# Patient Record
Sex: Male | Born: 1935 | Race: White | Hispanic: No | Marital: Married | Smoking: Former smoker
Health system: Southern US, Community
[De-identification: ages and names within clinical notes are randomized; demographics above are authoritative.]

## PROBLEM LIST (undated history)

## (undated) DIAGNOSIS — Z8719 Personal history of other diseases of the digestive system: Secondary | ICD-10-CM

## (undated) DIAGNOSIS — C801 Malignant (primary) neoplasm, unspecified: Secondary | ICD-10-CM

## (undated) DIAGNOSIS — M1990 Unspecified osteoarthritis, unspecified site: Secondary | ICD-10-CM

## (undated) DIAGNOSIS — I10 Essential (primary) hypertension: Secondary | ICD-10-CM

## (undated) DIAGNOSIS — C4490 Unspecified malignant neoplasm of skin, unspecified: Secondary | ICD-10-CM

## (undated) DIAGNOSIS — E119 Type 2 diabetes mellitus without complications: Secondary | ICD-10-CM

## (undated) DIAGNOSIS — E7800 Pure hypercholesterolemia, unspecified: Secondary | ICD-10-CM

## (undated) DIAGNOSIS — K219 Gastro-esophageal reflux disease without esophagitis: Secondary | ICD-10-CM

## (undated) HISTORY — PX: CHOLECYSTECTOMY: SHX55

## (undated) HISTORY — PX: CATARACT EXTRACTION W/ INTRAOCULAR LENS  IMPLANT, BILATERAL: SHX1307

## (undated) HISTORY — PX: JOINT REPLACEMENT: SHX530

## (undated) HISTORY — PX: HERNIA REPAIR: SHX51

---

## 2003-08-22 ENCOUNTER — Other Ambulatory Visit: Payer: Self-pay

## 2007-10-03 ENCOUNTER — Ambulatory Visit: Payer: Self-pay | Admitting: Internal Medicine

## 2007-10-27 ENCOUNTER — Ambulatory Visit: Payer: Self-pay | Admitting: Family Medicine

## 2007-10-28 ENCOUNTER — Ambulatory Visit: Payer: Self-pay | Admitting: Family Medicine

## 2008-06-06 ENCOUNTER — Ambulatory Visit: Payer: Self-pay | Admitting: Orthopedic Surgery

## 2008-06-13 ENCOUNTER — Inpatient Hospital Stay: Payer: Self-pay | Admitting: Orthopedic Surgery

## 2008-06-21 ENCOUNTER — Encounter: Payer: Self-pay | Admitting: Internal Medicine

## 2008-06-28 ENCOUNTER — Ambulatory Visit: Payer: Self-pay | Admitting: Orthopedic Surgery

## 2008-06-28 ENCOUNTER — Encounter: Payer: Self-pay | Admitting: Internal Medicine

## 2008-07-02 ENCOUNTER — Encounter: Payer: Self-pay | Admitting: Orthopedic Surgery

## 2008-07-09 ENCOUNTER — Ambulatory Visit: Payer: Self-pay | Admitting: Family Medicine

## 2008-07-28 ENCOUNTER — Encounter: Payer: Self-pay | Admitting: Orthopedic Surgery

## 2009-05-11 IMAGING — CR DG CHEST 2V
1 series · 3 of 3 positions shown · non-contrast
Comparison: none

REASON FOR EXAM: f/u pneumonia
COMMENTS:

PROCEDURE:     DXR - DXR CHEST PA (OR AP) AND LATERAL  - June 20, 2008  [DATE]
RESULT:     Comparison: 06/15/2008

[Series 1: view not recorded · 0.17mm/px · 3 of 3 slices shown]
[im 1/3]
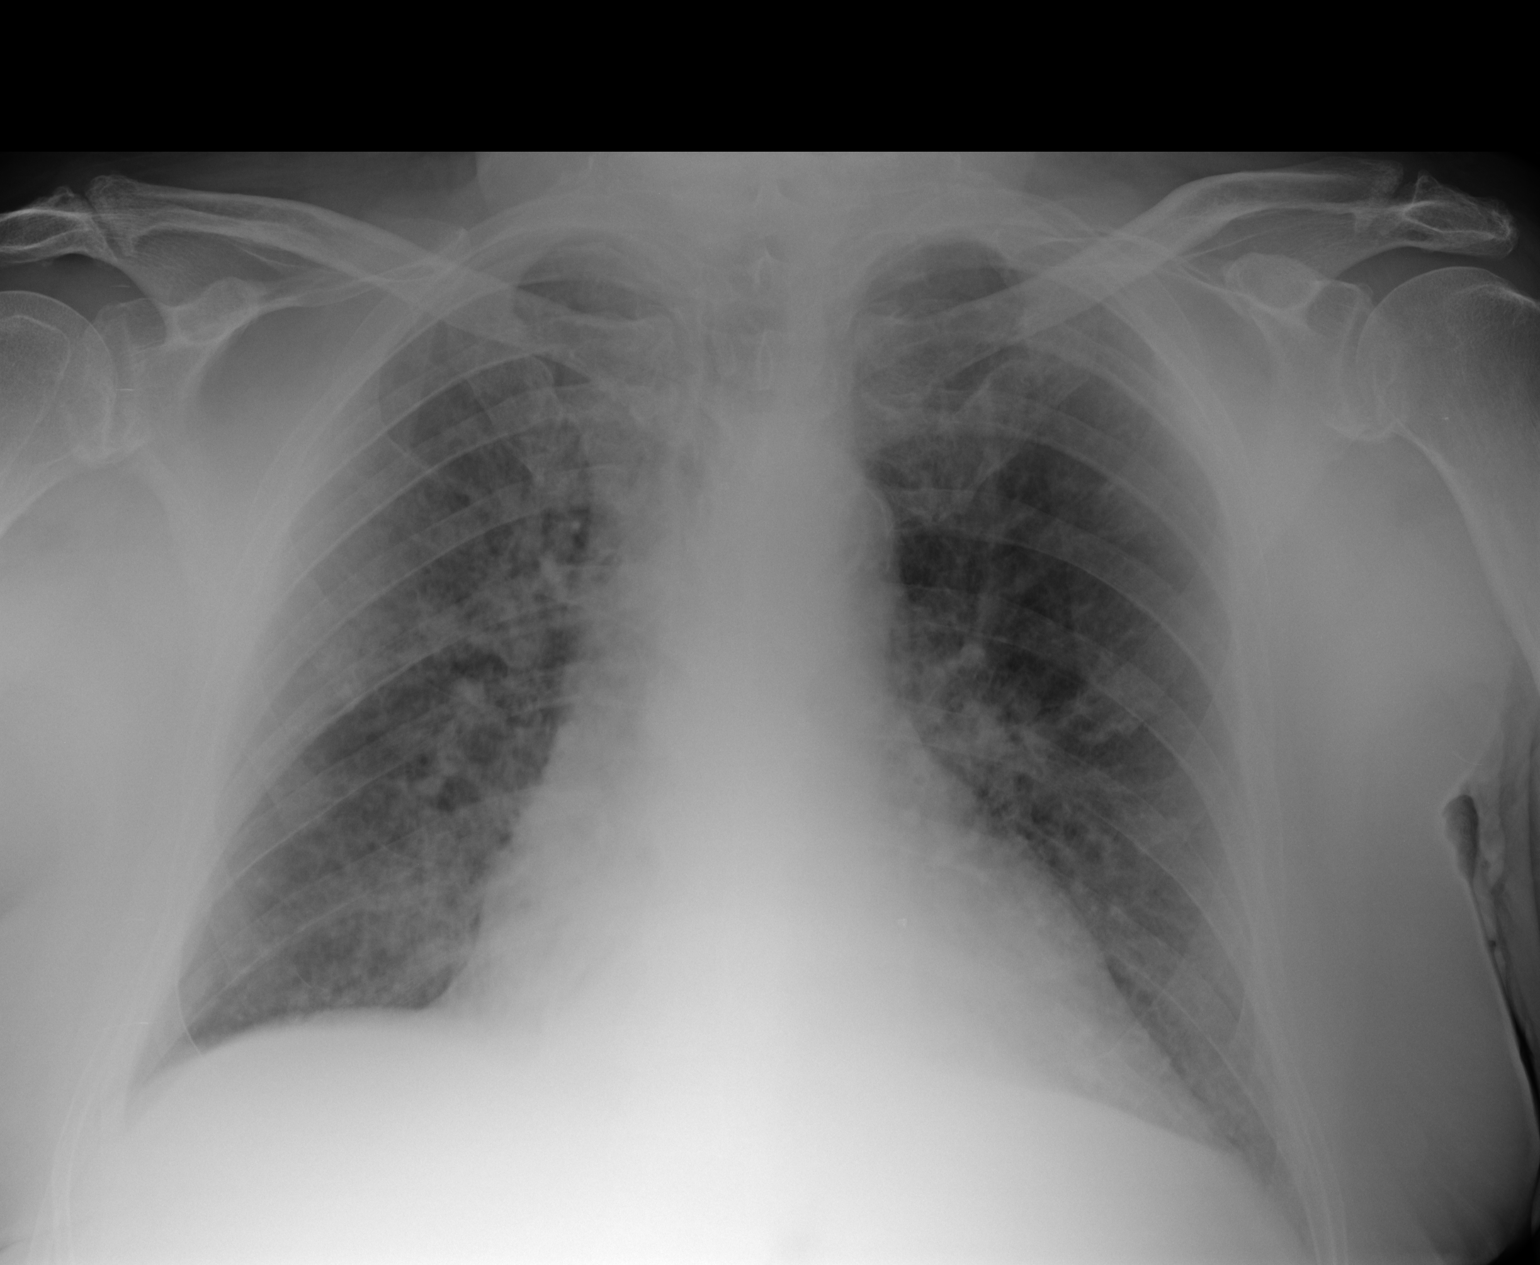
[im 2/3]
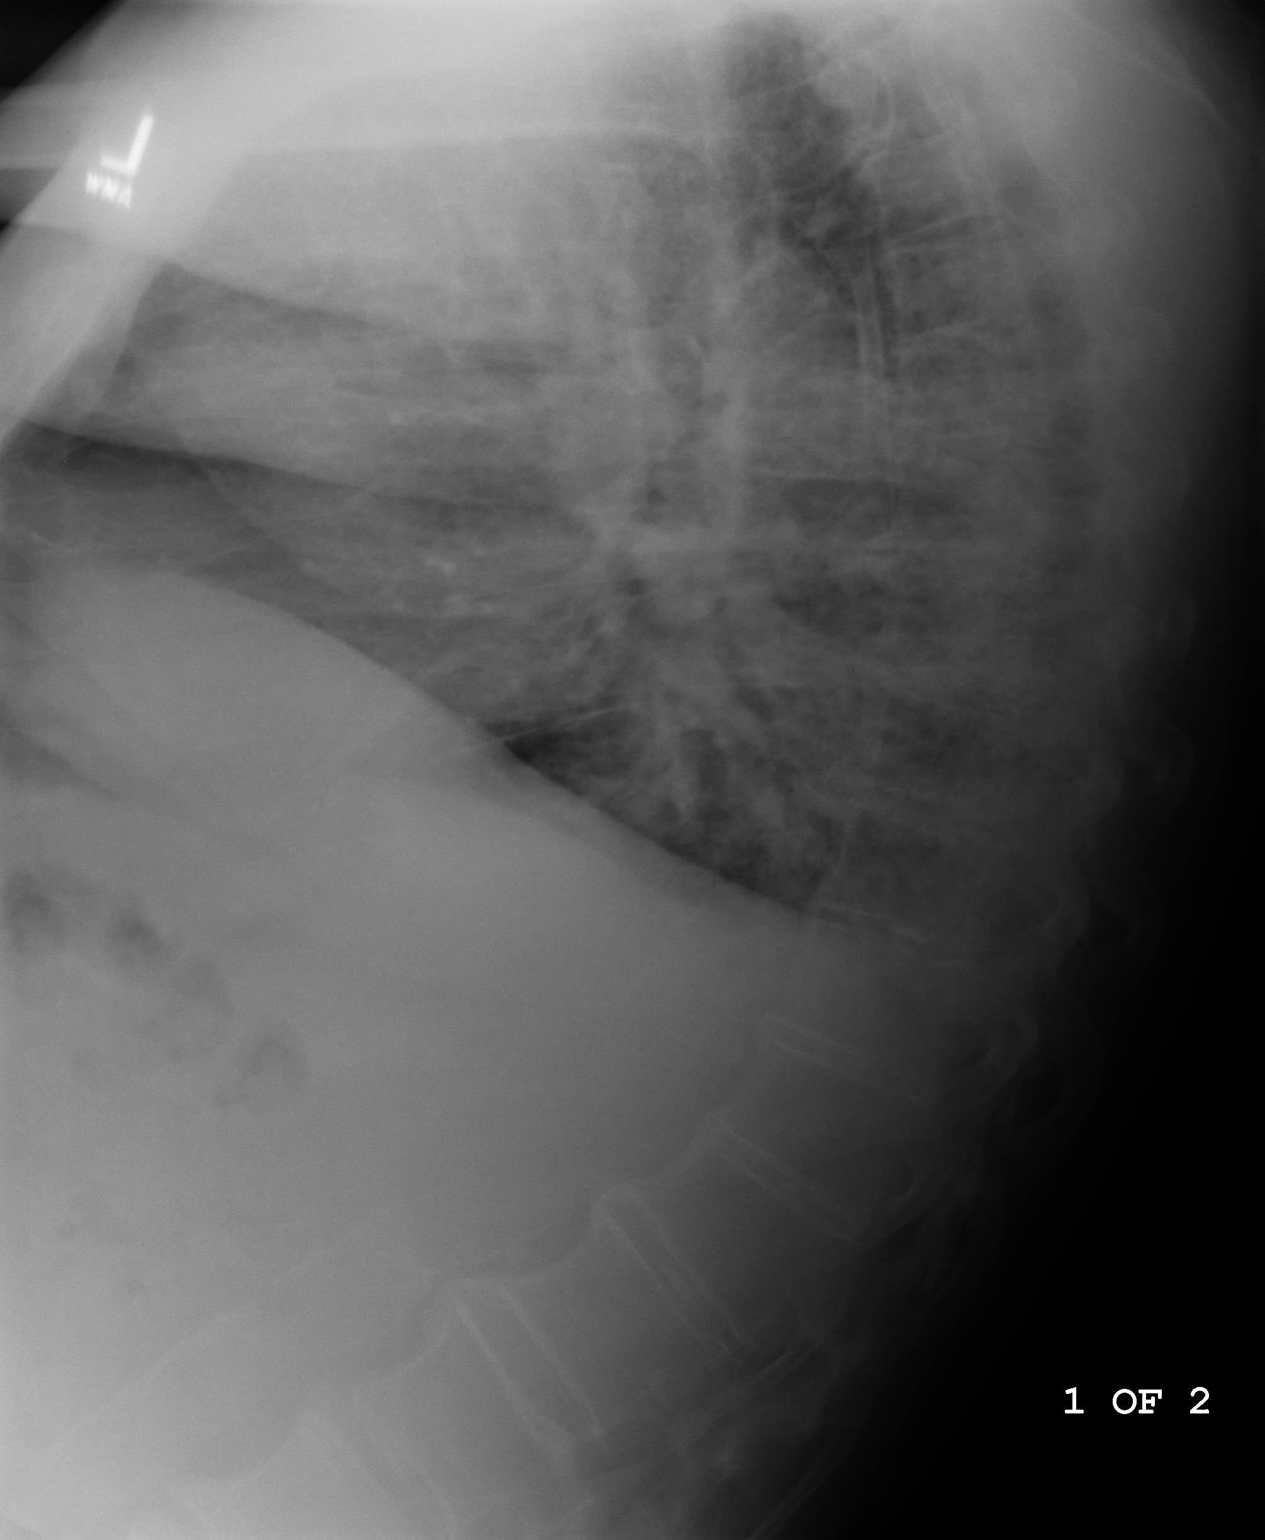
[im 3/3]
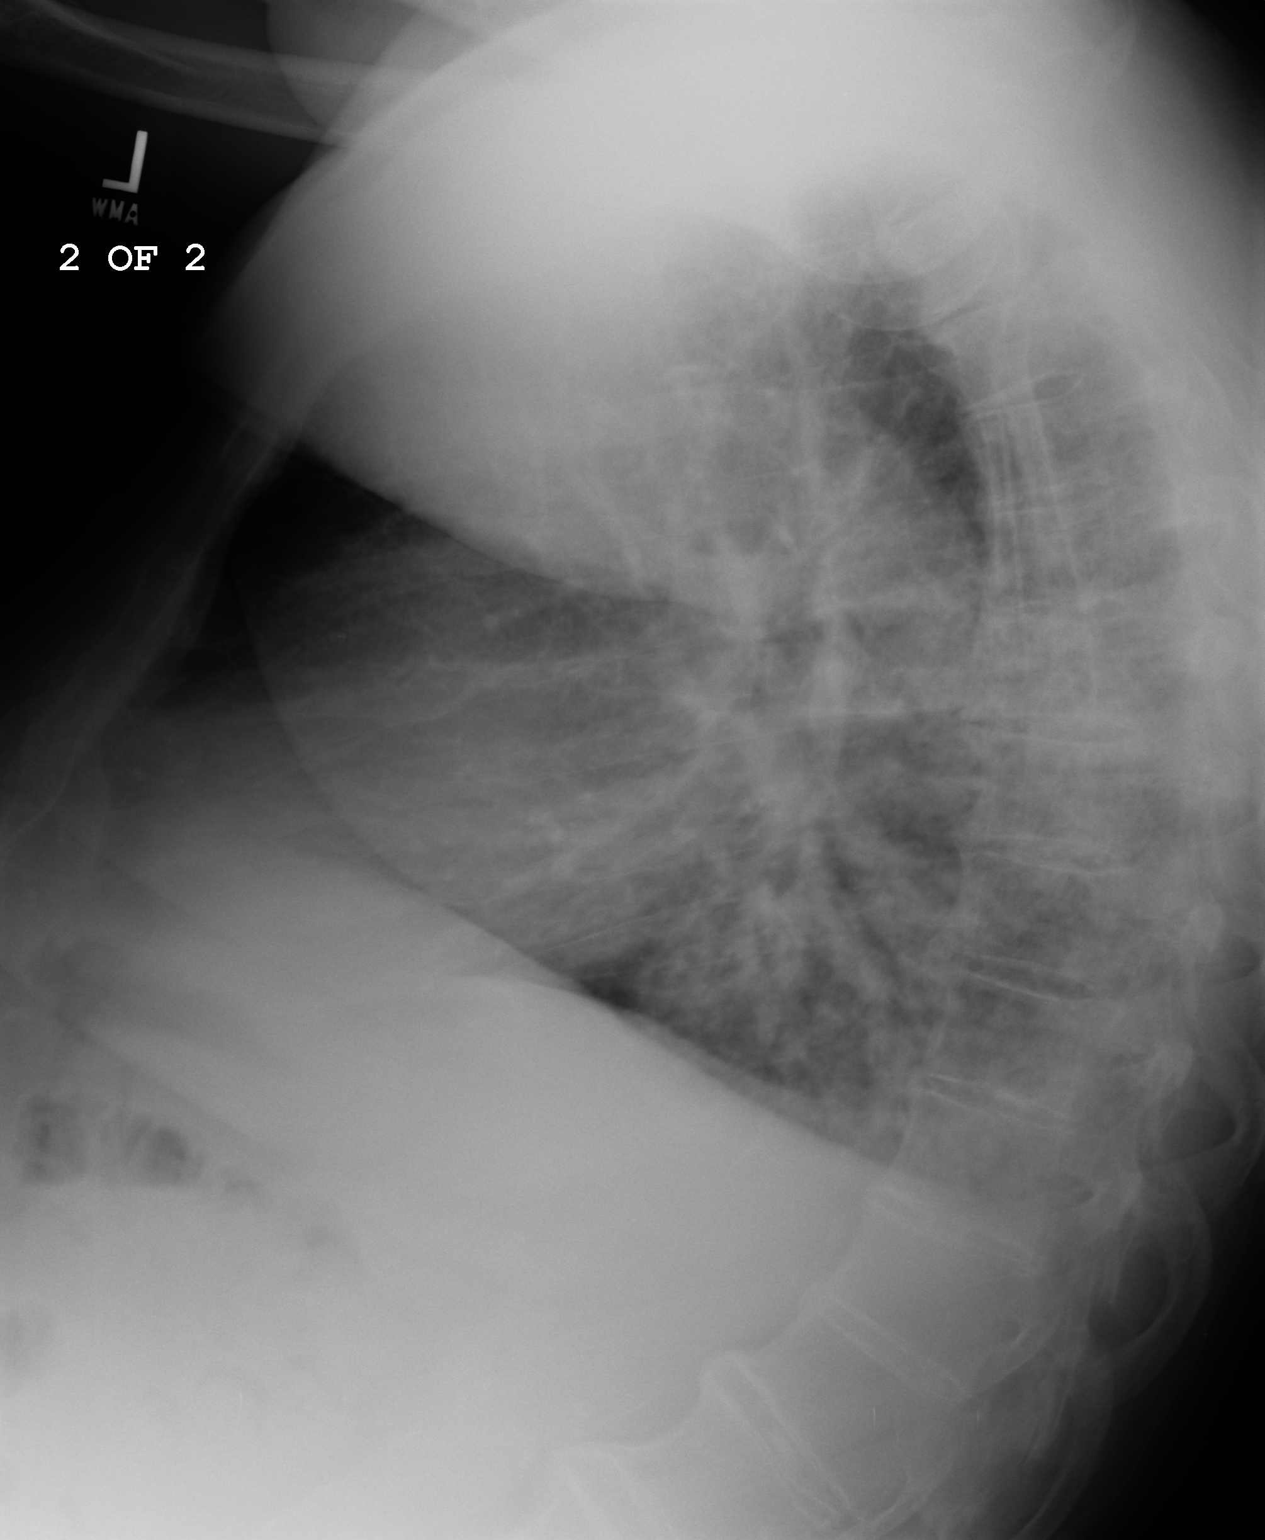

[3 of 3 positions shown; findings below may reference images not displayed]

FINDINGS: PA and lateral chest radiographs are provided. There are is bilateral patchy
interstitial and alveolar airspace disease, left greater than right. There
is no significant pleural effusion. The heart size is mildly enlarged. There
is thoracic spine spondylosis.
IMPRESSION: Bilateral patchy interstitial and alveolar airspace opacities with
cardiomegaly. This may reflect mild pulmonary edema, although an
interstitial pneumonitis can have a similar appearance. Correlate with
clinical findings.

## 2009-05-30 IMAGING — CR DG CHEST 2V
1 series · 2 of 2 positions shown · non-contrast
Comparison: none

REASON FOR EXAM: f/u pneumonia  density right upper lobe
COMMENTS:

[Series 1: view not recorded · 0.17mm/px · 2 of 2 slices shown]
[im 1/2]
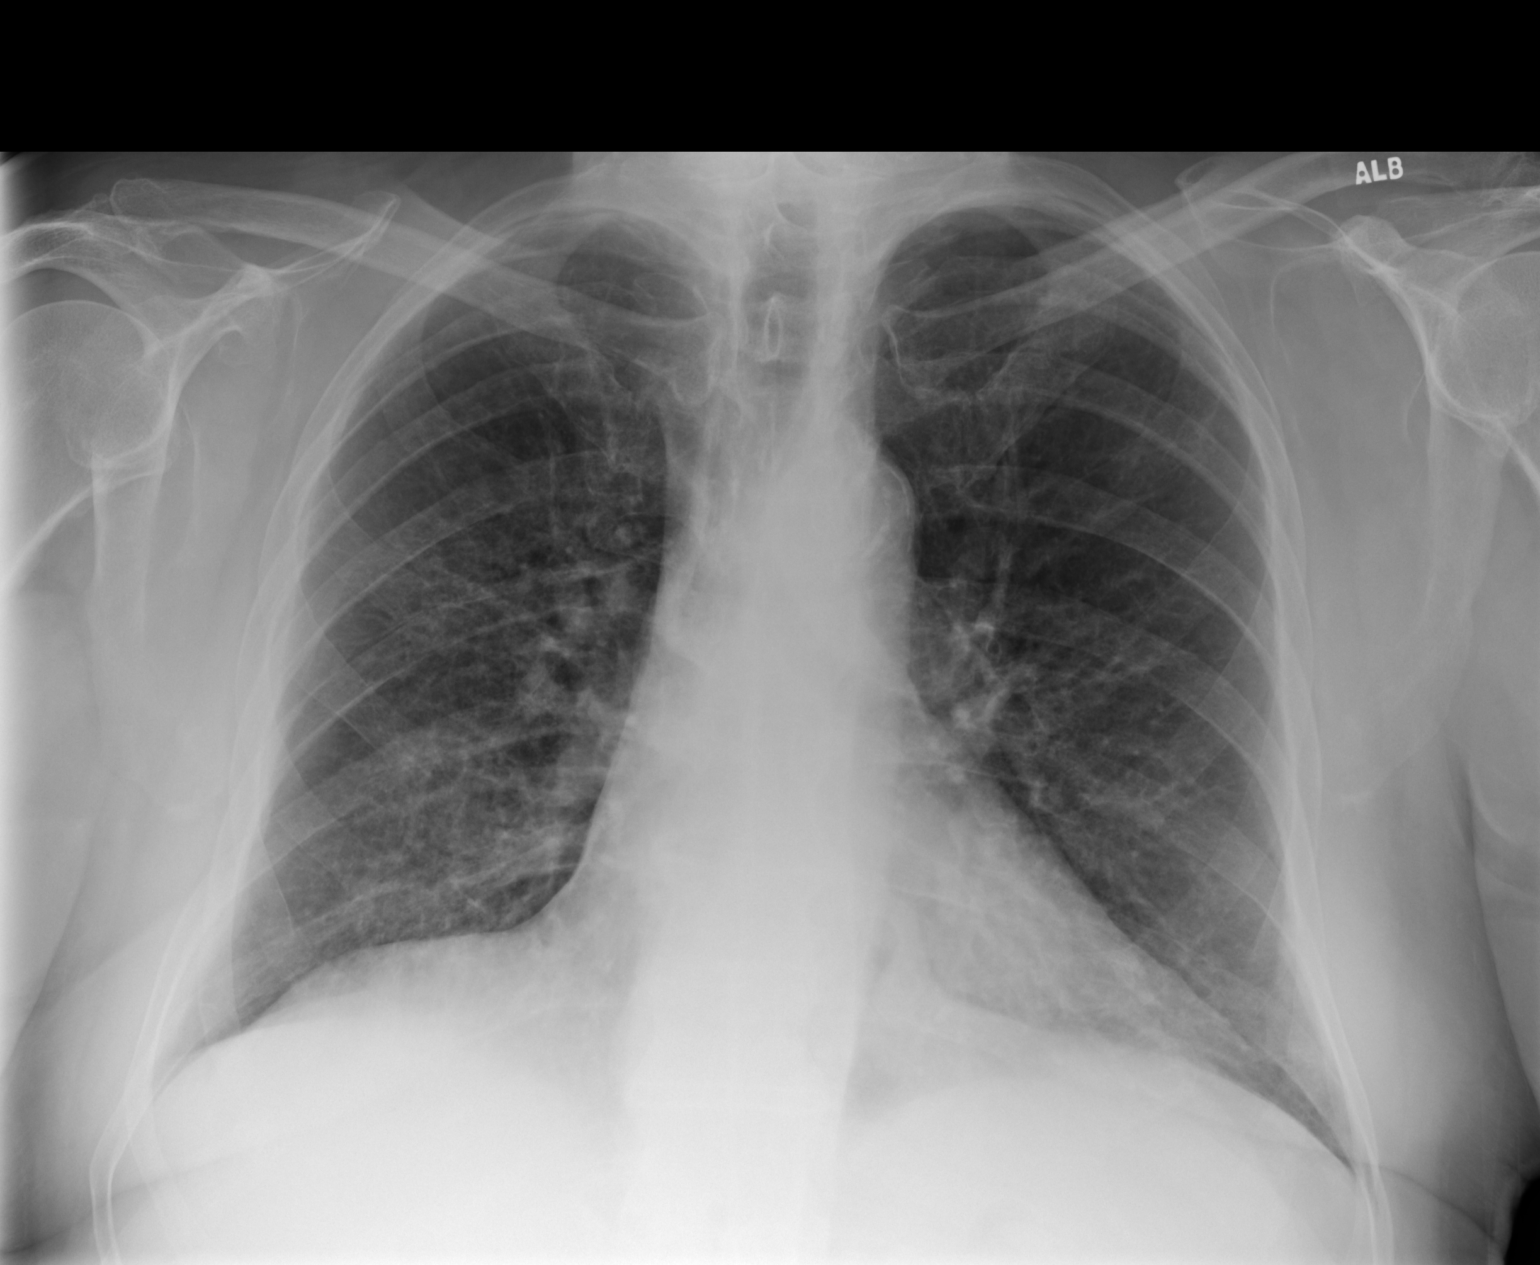
[im 2/2]
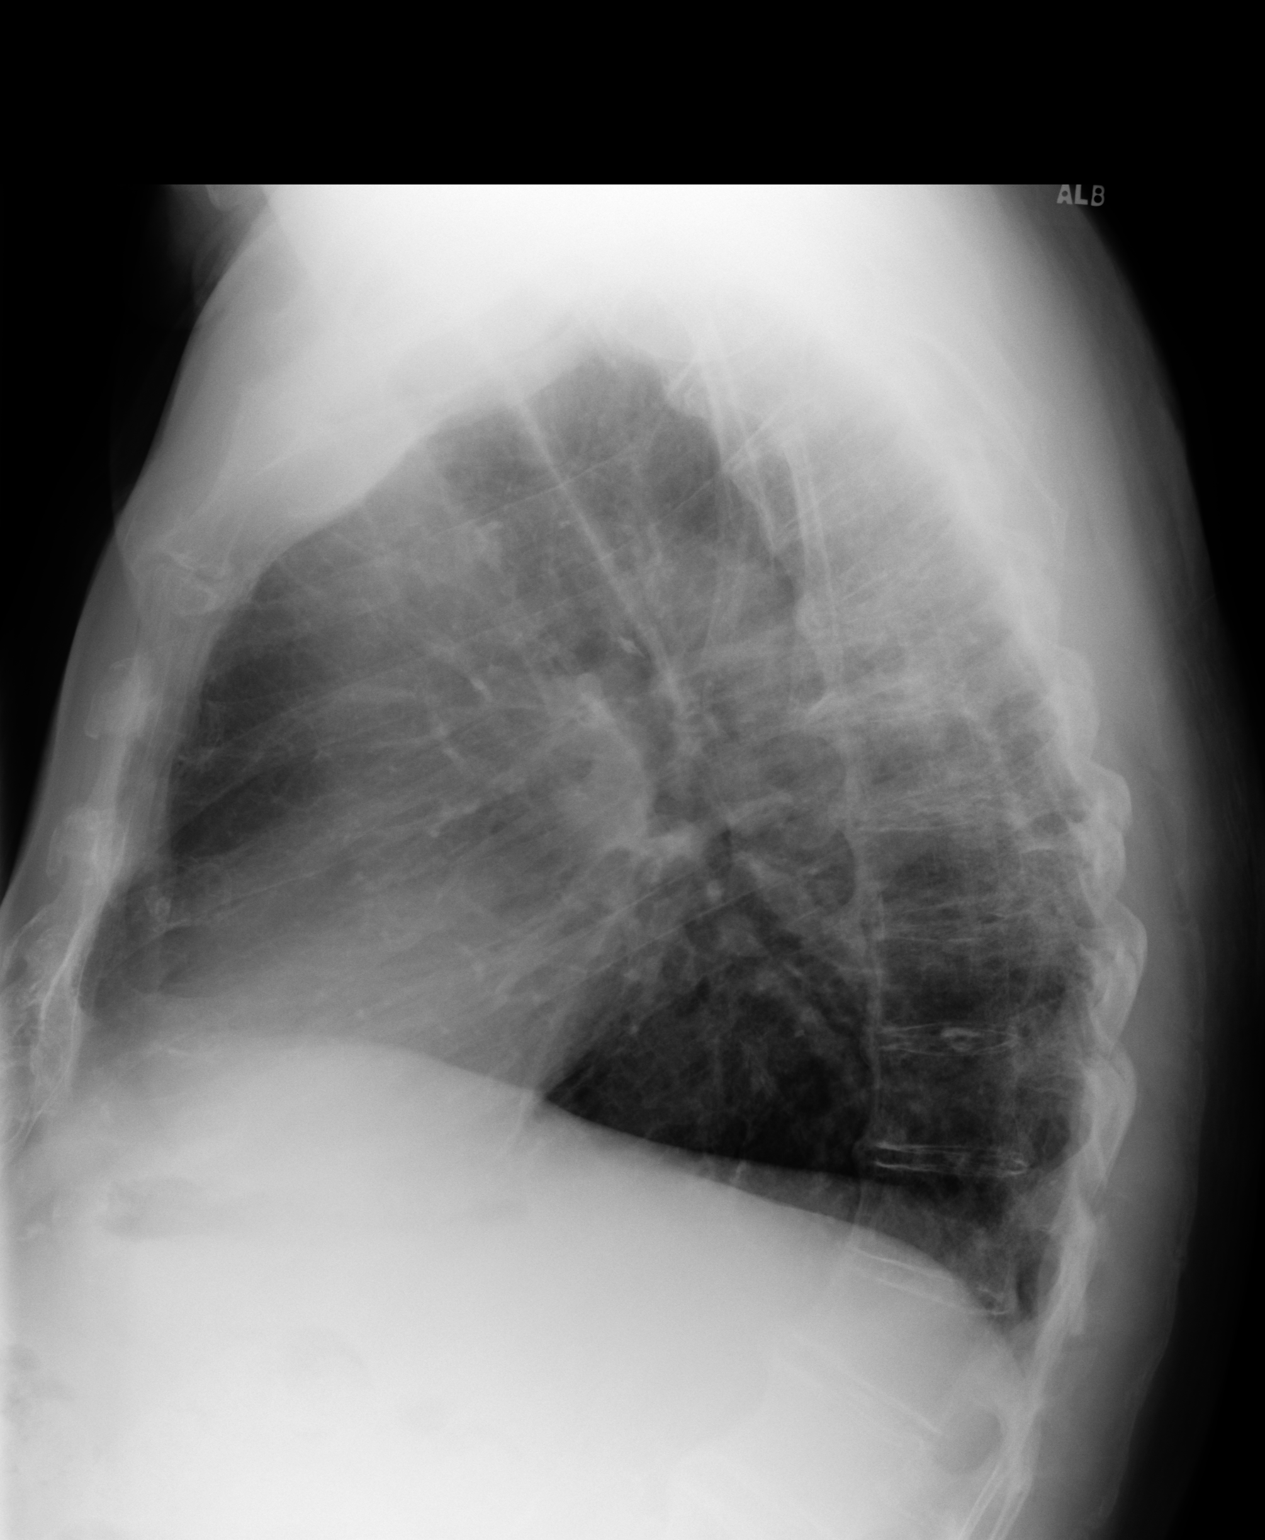

[2 of 2 positions shown; findings below may reference images not displayed]

PROCEDURE:     MDR - MDR CHEST PA(OR AP) AND LATERAL  - July 09, 2008  [DATE]

RESULT:     The previously noted patchy density in the right upper lobe
shows complete or near complete clearing. No new focal infiltrates are seen.
There is again noted thickening of the lung markings bilaterally consistent
with chronic inflammatory change. The chest is hyperexpanded compatible with
COPD. Heart size is normal.
IMPRESSION: 1. There has been complete or near complete clearing of the previously noted
right upper lobe infiltrate.
2. COPD.

## 2011-05-20 ENCOUNTER — Ambulatory Visit: Payer: Self-pay | Admitting: Ophthalmology

## 2011-07-22 ENCOUNTER — Ambulatory Visit: Payer: Self-pay | Admitting: Ophthalmology

## 2015-02-18 ENCOUNTER — Ambulatory Visit
Admission: EM | Admit: 2015-02-18 | Discharge: 2015-02-18 | Disposition: A | Discharge status: Home / Self Care | Payer: PPO | Attending: Family Medicine | Admitting: Family Medicine

## 2015-02-18 ENCOUNTER — Encounter: Payer: Self-pay | Admitting: *Deleted

## 2015-02-18 DIAGNOSIS — M542 Cervicalgia: Secondary | ICD-10-CM

## 2015-02-18 DIAGNOSIS — S161XXA Strain of muscle, fascia and tendon at neck level, initial encounter: Secondary | ICD-10-CM

## 2015-02-18 DIAGNOSIS — R079 Chest pain, unspecified: Secondary | ICD-10-CM | POA: Diagnosis present

## 2015-02-18 HISTORY — DX: Malignant (primary) neoplasm, unspecified: C80.1

## 2015-02-18 HISTORY — DX: Type 2 diabetes mellitus without complications: E11.9

## 2015-02-18 HISTORY — DX: Essential (primary) hypertension: I10

## 2015-02-18 MED ORDER — HYDROCODONE-ACETAMINOPHEN 5-325 MG PO TABS: ORAL_TABLET | ORAL | Status: DC

## 2015-02-18 MED ORDER — IBUPROFEN 600 MG PO TABS
600.0000 mg | ORAL_TABLET | Freq: Once | ORAL | Status: AC
  Administered 2015-02-18: 600 mg via ORAL

## 2015-02-18 NOTE — ED Notes (Signed)
Starting this AM, both sides of neck began hurting, now radiating to left elbow and across chest.  O2 at 2/liters applied, ekg obtained.

## 2015-03-21 NOTE — ED Provider Notes (Signed)
CSN: [id number]     Arrival date & time 02/18/15  1007 History   First MD Initiated Contact with Patient 02/18/15 1233     Chief Complaint  Patient presents with  . Chest Pain   (Consider location/radiation/quality/duration/timing/severity/associated sxs/prior Treatment) HPI Comments: 81 yo male with a c/o neck pain since this morning. States woke up with the pain, which intermittently radiates to left elbow. Denies any injuries, trauma, falls, numbness/tingling, weakness, shortness of breath, or chest pressure.   The history is provided by the patient.    Past Medical History  Diagnosis Date  . Diabetes mellitus without complication ([place])   . Hypertension   . Cancer [facility])    Past Surgical History  Procedure Laterality Date  . Cholecystectomy    . Joint replacement     History reviewed. No pertinent family history. Social History  Substance Use Topics  . Smoking status: Former Research scientist (life sciences)  . Smokeless tobacco: None  . Alcohol Use: No    Review of Systems  Allergies  Review of patient's allergies indicates not on file.  Home Medications   Prior to Admission medications   Medication Sig Start Date End Date Taking? Authorizing Provider  insulin glargine (LANTUS) 100 UNIT/ML injection Inject into the skin at bedtime.   Yes Historical Provider, MD  HYDROcodone-acetaminophen (NORCO/VICODIN) 5-325 MG tablet 1-2 tabs po qd 8 hours prn 02/18/15   Norval Gable, MD   Meds Ordered and Administered this Visit   Medications  ibuprofen (ADVIL,MOTRIN) tablet 600 mg (600 mg Oral Given 02/18/15 1240)    BP 154/56 mmHg  Pulse 72  Temp(Src) 97.2 F (36.2 C) (Oral)  Resp 20  Ht 5\' 10"  (1.778 m)  Wt 225 lb (102.059 kg)  BMI 32.28 kg/m2  SpO2 100% No data found.   Physical Exam  Constitutional: He appears well-developed and well-nourished. No distress.  HENT:  Head: Normocephalic and atraumatic.  Right Ear: Tympanic membrane, external ear and ear canal normal.  Left Ear:  Tympanic membrane, external ear and ear canal normal.  Nose: Nose normal.  Mouth/Throat: Uvula is midline, oropharynx is clear and moist and mucous membranes are normal. No oropharyngeal exudate or tonsillar abscesses.  Eyes: Conjunctivae and EOM are normal. Pupils are equal, round, and reactive to light. Right eye exhibits no discharge. Left eye exhibits no discharge. No scleral icterus.  Neck: Normal range of motion. Neck supple. No tracheal deviation present. No thyromegaly present.  Cardiovascular: Normal rate, regular rhythm and normal heart sounds.   Pulmonary/Chest: Effort normal and breath sounds normal. No stridor. No respiratory distress. He has no wheezes. He has no rales. He exhibits no tenderness.  Musculoskeletal:       Cervical back: He exhibits tenderness (over the cervical paraspinous muscles and trapezius muscles).  Lymphadenopathy:    He has no cervical adenopathy.  Neurological: He is alert.  Skin: Skin is warm and dry. No rash noted. He is not diaphoretic.  Nursing note and vitals reviewed.   ED Course  Procedures (including critical care time)  Labs Review Labs Reviewed - No data to display  Imaging Review No results found.   Visual Acuity Review  Right Eye Distance:   Left Eye Distance:   Bilateral Distance:    Right Eye Near:   Left Eye Near:    Bilateral Near:       EKG: normal EKG, normal sinus rhythm, there are no previous tracings available for comparison; reviewed by me and agree.  MDM   1. Neck  pain   2. Neck strain, initial encounter    Discharge Medication List as of 02/18/2015  1:42 PM    START taking these medications   Details  HYDROcodone-acetaminophen (NORCO/VICODIN) 5-325 MG tablet 1-2 tabs po qd 8 hours prn, Print       1. EKG result diagnosis reviewed with patient  2. rx as per orders above; reviewed possible side effects, interactions, risks and benefits  3. Recommend supportive treatment with rest, ice/heat, gentle range  of motion exercises 4. Follow-up prn if symptoms worsen or don't improve    Norval Gable, MD 03/21/15 1843

## 2015-05-16 DIAGNOSIS — D485 Neoplasm of uncertain behavior of skin: Secondary | ICD-10-CM | POA: Diagnosis not present

## 2015-05-16 DIAGNOSIS — C44712 Basal cell carcinoma of skin of right lower limb, including hip: Secondary | ICD-10-CM | POA: Diagnosis not present

## 2015-05-16 DIAGNOSIS — L820 Inflamed seborrheic keratosis: Secondary | ICD-10-CM | POA: Diagnosis not present

## 2015-05-24 DIAGNOSIS — C44712 Basal cell carcinoma of skin of right lower limb, including hip: Secondary | ICD-10-CM | POA: Diagnosis not present

## 2015-09-04 DIAGNOSIS — H02009 Unspecified entropion of unspecified eye, unspecified eyelid: Secondary | ICD-10-CM | POA: Diagnosis not present

## 2015-09-05 DIAGNOSIS — H02002 Unspecified entropion of right lower eyelid: Secondary | ICD-10-CM | POA: Diagnosis not present

## 2015-09-10 DIAGNOSIS — H5712 Ocular pain, left eye: Secondary | ICD-10-CM | POA: Diagnosis not present

## 2015-10-03 DIAGNOSIS — H02045 Spastic entropion of left lower eyelid: Secondary | ICD-10-CM | POA: Diagnosis not present

## 2015-10-04 DIAGNOSIS — H169 Unspecified keratitis: Secondary | ICD-10-CM | POA: Diagnosis not present

## 2015-11-17 ENCOUNTER — Ambulatory Visit
Admission: EM | Admit: 2015-11-17 | Discharge: 2015-11-17 | Disposition: A | Discharge status: Home / Self Care | Payer: PPO | Attending: Family Medicine | Admitting: Family Medicine

## 2015-11-17 DIAGNOSIS — H109 Unspecified conjunctivitis: Secondary | ICD-10-CM | POA: Diagnosis not present

## 2015-11-17 DIAGNOSIS — S0501XA Injury of conjunctiva and corneal abrasion without foreign body, right eye, initial encounter: Secondary | ICD-10-CM

## 2015-11-17 MED ORDER — CARBOXYMETHYLCELLULOSE SODIUM 1 % OP SOLN: 1.0000 [drp] | Freq: Three times a day (TID) | OPHTHALMIC | 0 refills | Status: AC

## 2015-11-17 MED ORDER — ERYTHROMYCIN 5 MG/GM OP OINT: TOPICAL_OINTMENT | OPHTHALMIC | 0 refills | Status: AC

## 2015-11-17 NOTE — ED Provider Notes (Signed)
CSN: [id number]     Arrival date & time 11/17/15  1516 History   First MD Initiated Contact with Patient 11/17/15 1539     Chief Complaint  Patient presents with  . Eye Pain   (Consider location/radiation/quality/duration/timing/severity/associated sxs/prior Treatment) Married caucasian male here for evaluation right eye pain s/p accidentally touching eye drop bottle to right eye when instilling drops today thinks he scratched his eyeball.  Preparing for eye surgery 5 Sep extropion.  + headache, light sensitivity, pain today.  Denied bleeding or loss of visual field or blurred vision.      Past Medical History:  Diagnosis Date  . Cancer ([place])   . Diabetes mellitus without complication ([place])   . Hypertension    Past Surgical History:  Procedure Laterality Date  . CHOLECYSTECTOMY    . JOINT REPLACEMENT     History reviewed. No pertinent family history. Social History  Substance Use Topics  . Smoking status: Former Research scientist (life sciences)  . Smokeless tobacco: Never Used  . Alcohol use No    Review of Systems  Constitutional: Negative for chills and fever.  HENT: Negative for ear pain and sore throat.   Eyes: Positive for photophobia, pain and redness. Negative for discharge, itching and visual disturbance.  Respiratory: Negative for cough and shortness of breath.   Cardiovascular: Negative for chest pain and palpitations.  Gastrointestinal: Negative for abdominal pain and vomiting.  Genitourinary: Negative for dysuria and hematuria.  Musculoskeletal: Negative for arthralgias and back pain.  Skin: Negative for color change and rash.  Allergic/Immunologic: Positive for environmental allergies. Negative for food allergies.  Neurological: Positive for headaches. Negative for dizziness, tremors, seizures, syncope, facial asymmetry, speech difficulty, weakness, light-headedness and numbness.  Hematological: Negative for adenopathy. Does not bruise/bleed easily.  Psychiatric/Behavioral: Negative  for sleep disturbance.  All other systems reviewed and are negative.   Allergies  Review of patient's allergies indicates no known allergies.  Home Medications   Prior to Admission medications   Medication Sig Start Date End Date Taking? Authorizing Provider  aspirin EC 81 MG tablet Take 81 mg by mouth daily.   Yes Historical Provider, MD  cholecalciferol (VITAMIN D) 400 units TABS tablet Take 400 Units by mouth.   Yes Historical Provider, MD  insulin lispro (HUMALOG) 100 UNIT/ML injection Inject into the skin 3 (three) times daily before meals.   Yes Historical Provider, MD  UNKNOWN TO PATIENT    Yes Historical Provider, MD  carboxymethylcellulose 1 % ophthalmic solution Apply 1 drop to eye 3 (three) times daily. 11/17/15 11/24/15  Olen Cordial, NP  erythromycin ophthalmic ointment Place a 1/2 inch ribbon of ointment into the lower eyelid right twice a day x 7 days 11/17/15 11/23/15  Olen Cordial, NP  insulin glargine (LANTUS) 100 UNIT/ML injection Inject into the skin at bedtime.    Historical Provider, MD   Meds Ordered and Administered this Visit  Medications - No data to display  BP (!) 141/53 (BP Location: Left Arm)   Pulse 68   Temp 97.9 F (36.6 C) (Tympanic)   Resp 15   Ht 6' (1.829 m)   Wt 225 lb (102.1 kg)   SpO2 98%   BMI 30.52 kg/m  No data found.   Physical Exam  Constitutional: He is oriented to person, place, and time. He appears well-developed and well-nourished.  Non-toxic appearance. He does not have a sickly appearance. He does not appear ill. No distress.  HENT:  Head: Normocephalic and atraumatic.  Right Ear:  Hearing and external ear normal.  Left Ear: Hearing and external ear normal.  Nose: Nose normal. Right sinus exhibits no maxillary sinus tenderness and no frontal sinus tenderness. Left sinus exhibits no maxillary sinus tenderness and no frontal sinus tenderness.  Mouth/Throat: Uvula is midline, oropharynx is clear and moist and mucous  membranes are normal. No oropharyngeal exudate, posterior oropharyngeal edema, posterior oropharyngeal erythema or tonsillar abscesses.  Eyes: EOM and lids are normal. Pupils are equal, round, and reactive to light. Right eye exhibits no chemosis, no discharge, no exudate and no hordeolum. No foreign body present in the right eye. Left eye exhibits no chemosis, no discharge, no exudate and no hordeolum. No foreign body present in the left eye. Right conjunctiva is injected. Right conjunctiva has a hemorrhage. Left conjunctiva is injected. Left conjunctiva has no hemorrhage. No scleral icterus. Right eye exhibits normal extraocular motion and no nystagmus. Left eye exhibits normal extraocular motion and no nystagmus. Right pupil is round and reactive. Left pupil is round and reactive. Pupils are equal.    Eyelid conjunctiva injected 2-3/4 bilaterally; bulbar conjuncitiva 1-2/4 bilaterally injected  Neck: Trachea normal, normal range of motion and phonation normal. Neck supple. No muscular tenderness present. No neck rigidity. Normal range of motion present. No thyromegaly present.  Cardiovascular: Normal rate and regular rhythm.   No murmur heard. Pulmonary/Chest: Effort normal and breath sounds normal. No respiratory distress. He has no decreased breath sounds. He has no wheezes. He has no rhonchi. He has no rales.  Abdominal: Soft. There is no tenderness.  Musculoskeletal: Normal range of motion. He exhibits no edema.  Lymphadenopathy:       Head (right side): No submental, no submandibular, no tonsillar, no preauricular, no posterior auricular and no occipital adenopathy present.       Head (left side): No submental, no submandibular, no tonsillar, no preauricular, no posterior auricular and no occipital adenopathy present.    He has no cervical adenopathy.       Right cervical: No superficial cervical, no deep cervical and no posterior cervical adenopathy present.      Left cervical: No  superficial cervical, no deep cervical and no posterior cervical adenopathy present.  Neurological: He is alert and oriented to person, place, and time. He has normal strength. He is not disoriented. He displays no atrophy and no tremor. No cranial nerve deficit. He exhibits normal muscle tone. He displays no seizure activity. Coordination and gait normal. GCS eye subscore is 4. GCS verbal subscore is 5. GCS motor subscore is 6.  Skin: Skin is warm, dry and intact. Capillary refill takes less than 2 seconds. No rash noted. He is not diaphoretic. No cyanosis. No pallor. Nails show no clubbing.  Psychiatric: He has a normal mood and affect. His speech is normal and behavior is normal. Judgment and thought content normal. Cognition and memory are normal.  Nursing note and vitals reviewed.   Urgent Care Course   Clinical Course    Procedures (including critical care time)  Labs Review Labs Reviewed - No data to display  Imaging Review No results found.   Visual Acuity Review  Right Eye Distance: 20/30 Left Eye Distance: 20/50 Bilateral Distance: 20/20  Right Eye Near:   Left Eye Near:    Bilateral Near:         MDM   1. Corneal abrasion, right, initial encounter   2. Conjunctivitis of right eye    Erythromycin opthalmic BID od 1/2 inch ribbon lower eyelid x  7 days.  Refresh gtts od 2 TID.  Shower after allergen exposure prior to bed.  Hygiene discussed. Dispose of current contacts and case. Patient to apply warm packs prn as directed.  Instructed patient to not rub eyes.  May use over the counter eye drops/tears for pain/symptom relief.  Return to clinic if headache, fever greater than 100.49F, nausea/vomiting, purulent discharge/matting unable to open eye without using fingers, foreign body sensation, ciliary flush, worsening photophobia or vision.  Call or return to clinic as needed if these symptoms worsen or fail to improve as anticipated.  Patient given [facility] handout on  allergic conjunctivitis and corneal abrasion.  Patient verbalized agreement and understanding of treatment plan.   P2:  Hand washing, avoid contact use-wear glasses.    Olen Cordial, NP 11/17/15 [phone]

## 2015-11-17 NOTE — ED Triage Notes (Signed)
Patient states that he was putting some drops in his eyes this morning and he got to close. Patient states that he may have scratched his eye with his fingernail. Patient eye is painful, red and draining.

## 2015-11-22 DIAGNOSIS — S0500XD Injury of conjunctiva and corneal abrasion without foreign body, unspecified eye, subsequent encounter: Secondary | ICD-10-CM | POA: Diagnosis not present

## 2015-11-22 DIAGNOSIS — S0500XA Injury of conjunctiva and corneal abrasion without foreign body, unspecified eye, initial encounter: Secondary | ICD-10-CM | POA: Diagnosis not present

## 2015-11-25 ENCOUNTER — Encounter: Payer: Self-pay | Admitting: *Deleted

## 2015-11-27 NOTE — Discharge Instructions (Signed)
INSTRUCTIONS FOLLOWING OCULOPLASTIC SURGERY °AMY M. FOWLER, MD ° °AFTER YOUR EYE SURGERY, THER ARE MANY THINGS THWIHC YOU, THE PATIENT, CAN DO TO ASSURE THE BEST POSSIBLE RESULT FROM YOUR OPERATION.  THIS SHEET SHOULD BE REFERRED TO WHENEVER QUESTIONS ARISE.  IF THERE ARE ANY QUESTIONS NOT ANSWERED HERE, DO NOT HESITATE TO CALL OUR OFFICE AT [phone] OR [phone].  THERE IS ALWAYS [facility] AVAILABLE TO CALL IF QUESTIONS OR PROBLEMS ARISE. ° °VISION: Your vision may be blurred and out of focus after surgery until you are able to stop using your ointment, swelling resolves and your eye(s) heal. This may take 1 to 2 weeks at the least.  If your vision becomes gradually more dim or dark, this is not normal and you need to call our office immediately. ° °EYE CARE: For the first 48 hours after surgery, use ice packs frequently - “20 minutes on, 20 minutes off” - to help reduce swelling and bruising.  Small bags of frozen peas or corn make good ice packs along with cloths soaked in ice water.  If you are wearing a patch or other type of dressing following surgery, keep this on for the amount of time specified by your doctor.  For the first week following surgery, you will need to treat your stitches with great care.  If is OK to shower, but take care to not allow soapy water to run into your eye(s) to help reduce changes of infection.  You may gently clean the eyelashes and around the eye(s) with cotton balls and sterile water, BUT DO NOT RUB THE STITCHES VIGOROUSLY.  Keeping your stitches moist with ointment will help promote healing with minimal scar formation. ° °ACTIVITY: When you leave the surgery center, you should go home, rest and be inactive.  The eye(s) may feel scratchy and keeping the eyes closed will allow for faster healing.  The first week following surgery, avoid straining (anything making the face turn red) or lifting over 20 pounds.  Additionally, avoid bending which causes your head to go below  your waist.  Using your eyes will NOT harm them, so feel free to read, watch television, use the computer, etc as desired.  Driving depends on each individual, so check with your doctor if you have questions about driving. ° °MEDICATIONS:  You will be given a prescription for an ointment to use 4 times a day on your stitches.  You can use the ointment in your eyes if they feel scratchy or irritated.  If you eyelid(s) don’t close completely when you sleep, put some ointment in your eyes before bedtime. ° °EMERGENCY: If you experience SEVERE EYE PAIN OR HEADACHE UNRELIEVED BY TYLENOL OR PERCOCET, NAUSEA OR VOMITING, WORSENING REDNESS, OR WORSENING VISION (ESPECIALLY VISION THAT WA INITIALLY BETTER) CALL [phone] OR [phone] DURING BUSINESS HOURS OR AFTER HOURS. ° °General Anesthesia, Adult, Care After °Refer to this sheet in the next few weeks. These instructions provide you with information on caring for yourself after your procedure. Your health care provider may also give you more specific instructions. Your treatment has been planned according to current medical practices, but problems sometimes occur. Call your health care provider if you have any problems or questions after your procedure. °WHAT TO EXPECT AFTER THE PROCEDURE °After the procedure, it is typical to experience: °· Sleepiness. °· Nausea and vomiting. °HOME CARE INSTRUCTIONS °· For the first 24 hours after general anesthesia: °¨ Have a responsible person with you. °¨ Do not drive a car. If you   are alone, do not take public transportation. °¨ Do not drink alcohol. °¨ Do not take medicine that has not been prescribed by your health care provider. °¨ Do not sign important papers or make important decisions. °¨ You may resume a normal diet and activities as directed by your health care provider. °· Change bandages (dressings) as directed. °· If you have questions or problems that seem related to general anesthesia, call the hospital and ask for  the anesthetist or anesthesiologist on call. °SEEK MEDICAL CARE IF: °· You have nausea and vomiting that continue the day after anesthesia. °· You develop a rash. °SEEK IMMEDIATE MEDICAL CARE IF:  °· You have difficulty breathing. °· You have chest pain. °· You have any allergic problems. °  °This information is not intended to replace advice given to you by your health care provider. Make sure you discuss any questions you have with your health care provider. °  °Document Released: 06/22/2000 Document Revised: 04/06/2014 Document Reviewed: 07/15/2011 °Elsevier Interactive Patient Education ©2016 [employer] ° °

## 2015-11-30 DIAGNOSIS — S0500XA Injury of conjunctiva and corneal abrasion without foreign body, unspecified eye, initial encounter: Secondary | ICD-10-CM | POA: Diagnosis not present

## 2015-11-30 DIAGNOSIS — S0500XD Injury of conjunctiva and corneal abrasion without foreign body, unspecified eye, subsequent encounter: Secondary | ICD-10-CM | POA: Diagnosis not present

## 2015-12-03 ENCOUNTER — Ambulatory Visit: Payer: PPO | Admitting: Anesthesiology

## 2015-12-03 ENCOUNTER — Ambulatory Visit
Admission: RE | Admit: 2015-12-03 | Discharge: 2015-12-03 | Disposition: A | Discharge status: Home / Self Care | Payer: PPO | Source: Ambulatory Visit | Attending: Ophthalmology | Admitting: Ophthalmology

## 2015-12-03 ENCOUNTER — Encounter
Admission: RE | Disposition: A | Discharge status: Home / Self Care | Payer: Self-pay | Source: Ambulatory Visit | Attending: Ophthalmology

## 2015-12-03 DIAGNOSIS — I10 Essential (primary) hypertension: Secondary | ICD-10-CM | POA: Insufficient documentation

## 2015-12-03 DIAGNOSIS — Z79899 Other long term (current) drug therapy: Secondary | ICD-10-CM | POA: Insufficient documentation

## 2015-12-03 DIAGNOSIS — Z87891 Personal history of nicotine dependence: Secondary | ICD-10-CM | POA: Diagnosis not present

## 2015-12-03 DIAGNOSIS — Z96653 Presence of artificial knee joint, bilateral: Secondary | ICD-10-CM | POA: Diagnosis not present

## 2015-12-03 DIAGNOSIS — E118 Type 2 diabetes mellitus with unspecified complications: Secondary | ICD-10-CM | POA: Diagnosis not present

## 2015-12-03 DIAGNOSIS — K219 Gastro-esophageal reflux disease without esophagitis: Secondary | ICD-10-CM | POA: Insufficient documentation

## 2015-12-03 DIAGNOSIS — H02005 Unspecified entropion of left lower eyelid: Secondary | ICD-10-CM | POA: Diagnosis not present

## 2015-12-03 DIAGNOSIS — H02045 Spastic entropion of left lower eyelid: Secondary | ICD-10-CM | POA: Insufficient documentation

## 2015-12-03 DIAGNOSIS — Z9841 Cataract extraction status, right eye: Secondary | ICD-10-CM | POA: Diagnosis not present

## 2015-12-03 DIAGNOSIS — Z794 Long term (current) use of insulin: Secondary | ICD-10-CM | POA: Diagnosis not present

## 2015-12-03 DIAGNOSIS — H02032 Senile entropion of right lower eyelid: Secondary | ICD-10-CM | POA: Diagnosis not present

## 2015-12-03 DIAGNOSIS — Z9842 Cataract extraction status, left eye: Secondary | ICD-10-CM | POA: Diagnosis not present

## 2015-12-03 DIAGNOSIS — H02042 Spastic entropion of right lower eyelid: Secondary | ICD-10-CM | POA: Diagnosis not present

## 2015-12-03 DIAGNOSIS — Z7982 Long term (current) use of aspirin: Secondary | ICD-10-CM | POA: Diagnosis not present

## 2015-12-03 DIAGNOSIS — K449 Diaphragmatic hernia without obstruction or gangrene: Secondary | ICD-10-CM | POA: Insufficient documentation

## 2015-12-03 DIAGNOSIS — Z85828 Personal history of other malignant neoplasm of skin: Secondary | ICD-10-CM | POA: Insufficient documentation

## 2015-12-03 DIAGNOSIS — H02035 Senile entropion of left lower eyelid: Secondary | ICD-10-CM | POA: Diagnosis not present

## 2015-12-03 HISTORY — DX: Pure hypercholesterolemia, unspecified: E78.00

## 2015-12-03 HISTORY — DX: Personal history of other diseases of the digestive system: Z87.19

## 2015-12-03 HISTORY — PX: ENTROPIAN REPAIR: SHX1512

## 2015-12-03 HISTORY — DX: Unspecified osteoarthritis, unspecified site: M19.90

## 2015-12-03 HISTORY — DX: Unspecified malignant neoplasm of skin, unspecified: C44.90

## 2015-12-03 HISTORY — DX: Gastro-esophageal reflux disease without esophagitis: K21.9

## 2015-12-03 LAB — GLUCOSE, CAPILLARY
GLUCOSE-CAPILLARY: 76 mg/dL (ref 65–99)
GLUCOSE-CAPILLARY: 82 mg/dL (ref 65–99)

## 2015-12-03 SURGERY — REPAIR, ENTROPION
Anesthesia: Monitor Anesthesia Care | Laterality: Bilateral | Wound class: Clean

## 2015-12-03 MED ORDER — OXYCODONE-ACETAMINOPHEN 5-325 MG PO TABS: 1.0000 | ORAL_TABLET | ORAL | 0 refills | Status: DC | PRN

## 2015-12-03 MED ORDER — LIDOCAINE-EPINEPHRINE 2 %-1:100000 IJ SOLN
INTRAMUSCULAR | Status: DC | PRN
  Administered 2015-12-03: 7 mL via OPHTHALMIC

## 2015-12-03 MED ORDER — ALFENTANIL 500 MCG/ML IJ INJ
INJECTION | INTRAMUSCULAR | Status: DC | PRN
  Administered 2015-12-03 (×2): 300 ug via INTRAVENOUS

## 2015-12-03 MED ORDER — TETRACAINE HCL 0.5 % OP SOLN
OPHTHALMIC | Status: DC | PRN
  Administered 2015-12-03: 2 [drp] via OPHTHALMIC

## 2015-12-03 MED ORDER — MIDAZOLAM HCL 2 MG/2ML IJ SOLN
INTRAMUSCULAR | Status: DC | PRN
  Administered 2015-12-03: 1 mg via INTRAVENOUS

## 2015-12-03 MED ORDER — ACETAMINOPHEN 325 MG PO TABS: 325.0000 mg | ORAL_TABLET | ORAL | Status: DC | PRN

## 2015-12-03 MED ORDER — ERYTHROMYCIN 5 MG/GM OP OINT
TOPICAL_OINTMENT | OPHTHALMIC | Status: DC | PRN
  Administered 2015-12-03: 1 via OPHTHALMIC

## 2015-12-03 MED ORDER — ACETAMINOPHEN 160 MG/5ML PO SOLN: 325.0000 mg | ORAL | Status: DC | PRN

## 2015-12-03 MED ORDER — LACTATED RINGERS IV SOLN
INTRAVENOUS | Status: DC
  Administered 2015-12-03: 07:00:00 via INTRAVENOUS

## 2015-12-03 MED ORDER — ERYTHROMYCIN 5 MG/GM OP OINT: TOPICAL_OINTMENT | OPHTHALMIC | 3 refills | Status: DC

## 2015-12-03 SURGICAL SUPPLY — 36 items
APPLICATOR COTTON TIP WD 3 STR (MISCELLANEOUS) ×6 IMPLANT
BLADE SURG 15 STRL LF DISP TIS (BLADE) ×1 IMPLANT
BLADE SURG 15 STRL SS (BLADE) ×2
CORD BIP STRL DISP 12FT (MISCELLANEOUS) ×3 IMPLANT
DRAPE HEAD BAR (DRAPES) ×3 IMPLANT
GAUZE SPONGE 4X4 12PLY STRL (GAUZE/BANDAGES/DRESSINGS) ×3 IMPLANT
GAUZE SPONGE NON-WVN 2X2 STRL (MISCELLANEOUS) ×10 IMPLANT
GLOVE SURG LX 7.0 MICRO (GLOVE) ×4
GLOVE SURG LX STRL 7.0 MICRO (GLOVE) ×2 IMPLANT
MARKER SKIN XFINE TIP W/RULER (MISCELLANEOUS) ×3 IMPLANT
NEEDLE FILTER BLUNT 18X 1/2SAF (NEEDLE) ×2
NEEDLE FILTER BLUNT 18X1 1/2 (NEEDLE) ×1 IMPLANT
NEEDLE HYPO 30X.5 LL (NEEDLE) ×6 IMPLANT
PACK DRAPE NASAL/ENT (PACKS) ×3 IMPLANT
SOL PREP PVP 2OZ (MISCELLANEOUS) ×3
SOLUTION PREP PVP 2OZ (MISCELLANEOUS) ×1 IMPLANT
SPONGE VERSALON 2X2 STRL (MISCELLANEOUS) ×20
SUT CHROMIC 4-0 (SUTURE) ×10
SUT CHROMIC 4-0 M2 12X2 ARM (SUTURE) ×5
SUT CHROMIC 5 0 P 3 (SUTURE) IMPLANT
SUT ETHILON 4 0 CL P 3 (SUTURE) IMPLANT
SUT MERSILENE 4-0 S-2 (SUTURE) ×6 IMPLANT
SUT PDS AB 4-0 P3 18 (SUTURE) IMPLANT
SUT PLAIN GUT (SUTURE) ×3 IMPLANT
SUT PROLENE 5 0 P 3 (SUTURE) IMPLANT
SUT PROLENE 6 0 P 1 18 (SUTURE) IMPLANT
SUT SILK 4 0 G 3 (SUTURE) IMPLANT
SUT VIC AB 5-0 P-3 18X BRD (SUTURE) IMPLANT
SUT VIC AB 5-0 P3 18 (SUTURE)
SUT VICRYL 6-0  S14 CTD (SUTURE)
SUT VICRYL 6-0 S14 CTD (SUTURE) IMPLANT
SUT VICRYL 7 0 TG140 8 (SUTURE) IMPLANT
SUTURE CHRMC 4-0 M2 12X2 ARM (SUTURE) ×5 IMPLANT
SYR 3ML LL SCALE MARK (SYRINGE) ×3 IMPLANT
SYRINGE 10CC LL (SYRINGE) ×3 IMPLANT
WATER STERILE IRR 500ML POUR (IV SOLUTION) ×3 IMPLANT

## 2015-12-03 NOTE — Anesthesia Postprocedure Evaluation (Signed)
Anesthesia Post Note  Patient: Joe Mack  Procedure(s) Performed: Procedure(s) (LRB): REPAIR OF ENTROPION SUTURES AND EXTENSIVE,  Bilateral lower entropion repair, bitateral lower entropion repair tarsal strips (Bilateral)  Patient location during evaluation: PACU Anesthesia Type: MAC Level of consciousness: awake and alert Pain management: pain level controlled Vital Signs Assessment: post-procedure vital signs reviewed and stable Respiratory status: spontaneous breathing, nonlabored ventilation and respiratory function stable Cardiovascular status: stable and blood pressure returned to baseline Anesthetic complications: no    Trecia Rogers

## 2015-12-03 NOTE — H&P (Signed)
  See the history and physical completed at [facility] on 11/05/15 and scanned into the chart.

## 2015-12-03 NOTE — Anesthesia Preprocedure Evaluation (Signed)
Anesthesia Evaluation  Patient identified by MRN, date of birth, ID band Patient awake    Reviewed: Allergy & Precautions, H&P , NPO status , Patient's Chart, lab work & pertinent test results, reviewed documented beta blocker date and time   Airway Mallampati: I  TM Distance: >3 FB Neck ROM: full    Dental no notable dental hx.    Pulmonary former smoker,    Pulmonary exam normal breath sounds clear to auscultation       Cardiovascular Exercise Tolerance: Good hypertension, Normal cardiovascular exam Rhythm:regular Rate:Normal     Neuro/Psych negative neurological ROS  negative psych ROS   GI/Hepatic Neg liver ROS, hiatal hernia, GERD  ,  Endo/Other  diabetes  Renal/GU negative Renal ROS  negative genitourinary   Musculoskeletal   Abdominal   Peds  Hematology negative hematology ROS (+)   Anesthesia Other Findings   Reproductive/Obstetrics negative OB ROS                             Anesthesia Physical Anesthesia Plan  ASA: II  Anesthesia Plan: MAC   Post-op Pain Management:    Induction:   Airway Management Planned:   Additional Equipment:   Intra-op Plan:   Post-operative Plan:   Informed Consent: I have reviewed the patients History and Physical, chart, labs and discussed the procedure including the risks, benefits and alternatives for the proposed anesthesia with the patient or authorized representative who has indicated his/her understanding and acceptance.   Dental Advisory Given  Plan Discussed with: CRNA  Anesthesia Plan Comments:         Anesthesia Quick Evaluation

## 2015-12-03 NOTE — Transfer of Care (Signed)
Immediate Anesthesia Transfer of Care Note  Patient: Joe Mack  Procedure(s) Performed: Procedure(s) with comments: REPAIR OF ENTROPION SUTURES AND EXTENSIVE,  Bilateral lower entropion repair, bitateral lower entropion repair tarsal strips (Bilateral) - DIABETIC - insulin  Patient Location: PACU  Anesthesia Type: MAC  Level of Consciousness: awake, alert  and patient cooperative  Airway and Oxygen Therapy: Patient Spontanous Breathing and Patient connected to supplemental oxygen  Post-op Assessment: Post-op Vital signs reviewed, Patient's Cardiovascular Status Stable, Respiratory Function Stable, Patent Airway and No signs of Nausea or vomiting  Post-op Vital Signs: Reviewed and stable  Complications: No apparent anesthesia complications

## 2015-12-03 NOTE — Op Note (Signed)
Preoperative Diagnosis:   1.  Lower eyelid laxity with entropion, both lower eyelid(s). 2.  Spastic Entropion both Lower eyelid(s)  Postoperative Diagnosis:   Same.  Procedure(s) Performed:  1.  Lateral tarsal strip procedure,  bilateral  lower eyelid(s). 2.  Quickert Sutures for entropion bilateral lower eyelid(s)  Surgeon: Philis Pique. Vickki Muff, M.D.  Assistants: none  Anesthesia: MAC  Specimens: None.  Estimated Blood Loss: Minimal.  Complications: None.  Operative Findings: None - no contact lens was still present in the left eye   Procedure:   Allergies were reviewed and the patient Review of patient's allergies indicates no known allergies..    After discussing the risks, benefits, complications, and alternatives with the patient, appropriate informed consent was obtained. The patient was brought to the operating suite and reclined supine. Time out was conducted and the patient was sedated.  Local anesthetic consisting of a 50-50 mixture of 2% lidocaine with epinephrine and 0.75% bupivacaine with added Hylenex was injected subcutaneously to the bilateral lateral canthal region(s) and lower eyelid(s). Additional anesthetic was injected subconjunctivally to the bilateral lower eyelid(s). Finally, anesthetic was injected down to the periosteum of the bilateral lateral orbital rim(s).  After adequate local was instilled, the patient was prepped and draped in the usual sterile fashion for eyelid surgery. Attention was turned to the left lateral canthal angle. Westcott scissors were used to create a lateral canthotomy. Hemostasis was obtained with bipolar cautery. An inferior cantholysis was then performed with additional bipolar hemostasis. The anterior and posterior lamella of the lid were divided for approximately 8 mm.  A strip of the epithelium was excised off the superior margin of the tarsal strip and conjunctiva and retractors were incised off the inferior margin of the tarsal strip.    Two double-armed 4-0 chromic sutures were passed medially and laterally through the lower lid from the conjunctival fornix, anteriorly through the skin just below the border of the tarsal plate. Each of these sutures was tied off and this created a nice outward rolling of the lid margin.  A double-armed 4-0 M[place]ilene suture was then passed each arm through the terminal portion of the tarsal strip. Each arm of the suture was then passed through the periosteum of the inner portion of the lateral orbital rim at the level of Whitnall's tubercle. The sutures were advanced and this provided nice elevation and tightening of the lower eyelid. Once the suture was secured, a thin strip of follicle-bearing skin was excised. The lateral canthal angle was reformed with an interrupted 6-0 fast absorbing plain suture. Orbicularis was reapproximated with horizontal subcuticular 6-0 fast absorbing plain gut sutures. The skin was closed with interrupted 6-0 fast absorbing plain gut sutures.   Attention was then turned to the opposite eyelid where the same procedure was performed in the same manner.   The patient tolerated the procedure well. Erythromycin ophthalmic ointment was applied to the incision site(s) followed by ice packs. The patient was taken to the recovery area where he recovered without difficulty.  Post-Op Plan/Instructions:  The patient was instructed to use ice packs frequently for the next 48 hours. He was instructed to use erythromycin ophthalmic ointment on his incisions 4 times a day for the next 12 to 14 days. He was given a prescription for Percocet for pain control should Tylenol not be effective. He was asked to to follow up at the [facility] in [place], [place] in 3-4 weeks' time or sooner as needed for problems.   Damari Suastegui M.  Vickki Muff, M.D. Ophthalmology

## 2015-12-03 NOTE — Interval H&P Note (Signed)
History and Physical Interval Note:  12/03/2015 7:40 AM  De Burrs  has presented today for surgery, with the diagnosis of H02.032 H02.035 ENTROPION SENILE OF EYELID H02.042 H02.045 ENTROPION SPASTIC OF EYELID  The various methods of treatment have been discussed with the patient and family. After consideration of risks, benefits and other options for treatment, the patient has consented to  Procedure(s) with comments: [place] (Bilateral) - DIABETIC - insulin as a surgical intervention .  The patient's history has been reviewed, patient examined, no change in status, stable for surgery.  I have reviewed the patient's chart and labs.  Questions were answered to the patient's satisfaction.     Vickki Muff, Holliday Sheaffer M

## 2015-12-03 NOTE — Anesthesia Procedure Notes (Signed)
Procedure Name: MAC Date/Time: 12/03/2015 7:47 AM Performed by: Cameron Ali Pre-anesthesia Checklist: Patient identified, Emergency Drugs available, Suction available, Timeout performed and Patient being monitored Patient Re-evaluated:Patient Re-evaluated prior to inductionOxygen Delivery Method: Nasal cannula Placement Confirmation: positive ETCO2

## 2015-12-04 ENCOUNTER — Encounter: Payer: Self-pay | Admitting: Ophthalmology

## 2016-02-04 DIAGNOSIS — L02821 Furuncle of head [any part, except face]: Secondary | ICD-10-CM | POA: Diagnosis not present

## 2016-02-27 DIAGNOSIS — E119 Type 2 diabetes mellitus without complications: Secondary | ICD-10-CM | POA: Diagnosis not present

## 2016-03-18 DIAGNOSIS — L570 Actinic keratosis: Secondary | ICD-10-CM | POA: Diagnosis not present

## 2016-03-18 DIAGNOSIS — L02821 Furuncle of head [any part, except face]: Secondary | ICD-10-CM | POA: Diagnosis not present

## 2016-06-30 NOTE — Progress Notes (Signed)
This encounter was created in error - please disregard.

## 2016-06-30 NOTE — Addendum Note (Signed)
Addended by: Virgel Manifold on: 06/30/2016 10:55 AM   Modules accepted: Level of Service, SmartSet

## 2016-12-11 DIAGNOSIS — E11649 Type 2 diabetes mellitus with hypoglycemia without coma: Secondary | ICD-10-CM | POA: Diagnosis not present

## 2016-12-11 DIAGNOSIS — Z96653 Presence of artificial knee joint, bilateral: Secondary | ICD-10-CM | POA: Diagnosis not present

## 2016-12-11 DIAGNOSIS — I6523 Occlusion and stenosis of bilateral carotid arteries: Secondary | ICD-10-CM | POA: Diagnosis not present

## 2016-12-11 DIAGNOSIS — J810 Acute pulmonary edema: Secondary | ICD-10-CM | POA: Diagnosis not present

## 2016-12-11 DIAGNOSIS — B370 Candidal stomatitis: Secondary | ICD-10-CM | POA: Diagnosis not present

## 2016-12-11 DIAGNOSIS — I639 Cerebral infarction, unspecified: Secondary | ICD-10-CM | POA: Diagnosis not present

## 2016-12-11 DIAGNOSIS — R918 Other nonspecific abnormal finding of lung field: Secondary | ICD-10-CM | POA: Diagnosis not present

## 2016-12-11 DIAGNOSIS — Z794 Long term (current) use of insulin: Secondary | ICD-10-CM | POA: Diagnosis not present

## 2016-12-11 DIAGNOSIS — I517 Cardiomegaly: Secondary | ICD-10-CM | POA: Diagnosis not present

## 2016-12-11 DIAGNOSIS — E538 Deficiency of other specified B group vitamins: Secondary | ICD-10-CM | POA: Diagnosis not present

## 2016-12-11 DIAGNOSIS — R1310 Dysphagia, unspecified: Secondary | ICD-10-CM | POA: Diagnosis not present

## 2016-12-11 DIAGNOSIS — I63522 Cerebral infarction due to unspecified occlusion or stenosis of left anterior cerebral artery: Secondary | ICD-10-CM | POA: Diagnosis not present

## 2016-12-11 DIAGNOSIS — J690 Pneumonitis due to inhalation of food and vomit: Secondary | ICD-10-CM | POA: Diagnosis not present

## 2016-12-11 DIAGNOSIS — R9089 Other abnormal findings on diagnostic imaging of central nervous system: Secondary | ICD-10-CM | POA: Diagnosis not present

## 2016-12-11 DIAGNOSIS — G9389 Other specified disorders of brain: Secondary | ICD-10-CM | POA: Diagnosis not present

## 2016-12-11 DIAGNOSIS — J811 Chronic pulmonary edema: Secondary | ICD-10-CM | POA: Diagnosis not present

## 2016-12-11 DIAGNOSIS — G9340 Encephalopathy, unspecified: Secondary | ICD-10-CM | POA: Diagnosis not present

## 2016-12-11 DIAGNOSIS — J984 Other disorders of lung: Secondary | ICD-10-CM | POA: Diagnosis not present

## 2016-12-11 DIAGNOSIS — R4182 Altered mental status, unspecified: Secondary | ICD-10-CM | POA: Diagnosis not present

## 2016-12-11 DIAGNOSIS — Z743 Need for continuous supervision: Secondary | ICD-10-CM | POA: Diagnosis not present

## 2016-12-11 DIAGNOSIS — E162 Hypoglycemia, unspecified: Secondary | ICD-10-CM | POA: Diagnosis not present

## 2016-12-14 DIAGNOSIS — R4182 Altered mental status, unspecified: Secondary | ICD-10-CM | POA: Diagnosis not present

## 2017-03-17 ENCOUNTER — Encounter: Payer: Self-pay | Admitting: *Deleted

## 2017-03-17 ENCOUNTER — Ambulatory Visit
Admission: EM | Admit: 2017-03-17 | Discharge: 2017-03-17 | Disposition: A | Discharge status: Home / Self Care | Payer: PPO | Attending: Family Medicine | Admitting: Family Medicine

## 2017-03-17 DIAGNOSIS — H61892 Other specified disorders of left external ear: Secondary | ICD-10-CM

## 2017-03-17 DIAGNOSIS — L98499 Non-pressure chronic ulcer of skin of other sites with unspecified severity: Secondary | ICD-10-CM

## 2017-03-17 NOTE — ED Triage Notes (Signed)
Skin ulcer behind left ear for unknown amount of time.

## 2017-03-17 NOTE — ED Provider Notes (Signed)
[facility]    CSN: [id number] Arrival date & time: 03/17/17  0930  History   Chief Complaint Chief Complaint  Patient presents with  . Wound Infection   HPI  83 year old male presents with a skin ulcer behind his left ear.  Patient states that he just noticed this a few days ago.  He states that it has been itching and has been slightly painful particularly with pressure from his glasses.  He has a history of skin cancer.  He reports drainage from the area.  No fever.  No reports of redness or warmth.  He has been applying Vaseline to keep the area moist.  No other interventions tried.  No known exacerbating factors.  No other associated symptoms.  No other complaints at this time.  Past Medical History:  Diagnosis Date  . Arthritis    right leg  . Cancer ([place])   . Diabetes mellitus without complication ([employer])   . GERD (gastroesophageal reflux disease)   . History of hiatal hernia   . Hypercholesteremia   . Hypertension   . Skin cancer    Past Surgical History:  Procedure Laterality Date  . CATARACT EXTRACTION W/ INTRAOCULAR LENS  IMPLANT, BILATERAL    . CHOLECYSTECTOMY    . ENTROPIAN REPAIR Bilateral 12/03/2015   Procedure: REPAIR OF ENTROPION SUTURES AND EXTENSIVE,  Bilateral lower entropion repair, bitateral lower entropion repair tarsal strips;  Surgeon: Karle Starch, MD;  Location: [facility];  Service: Ophthalmology;  Laterality: Bilateral;  DIABETIC - insulin  . HERNIA REPAIR    . JOINT REPLACEMENT Bilateral    knees   Home Medications    Prior to Admission medications   Medication Sig Start Date End Date Taking? Authorizing Provider  amLODipine (NORVASC) 10 MG tablet Take 10 mg by mouth daily.   Yes [provider]  aspirin EC 81 MG tablet Take 81 mg by mouth daily.   Yes [provider]  cholecalciferol (VITAMIN D) 400 units TABS tablet Take 400 Units by mouth.   Yes [provider]  esomeprazole (NEXIUM) 20 MG  packet Take 20 mg by mouth 2 (two) times daily.   Yes [provider]  finasteride (PROSCAR) 5 MG tablet Take 5 mg by mouth daily.   Yes [provider]  insulin glargine (LANTUS) 100 UNIT/ML injection Inject 45 Units into the skin 2 (two) times daily.    Yes [provider]  insulin lispro (HUMALOG) 100 UNIT/ML injection Inject 30 Units into the skin 2 (two) times daily.    Yes [provider]  Omega-3 Fatty Acids (FISH OIL) 1000 MG CAPS Take by mouth 2 (two) times daily.   Yes [provider]  simvastatin (ZOCOR) 20 MG tablet Take 20 mg by mouth daily.   Yes [provider]  terazosin (HYTRIN) 10 MG capsule Take 10 mg by mouth at bedtime.   Yes [provider]    Family History Family History  Problem Relation Age of Onset  . Kidney failure Mother     Social History Social History   Tobacco Use  . Smoking status: Former Smoker    Last attempt to quit: 03/30/1988    Years since quitting: 28.9  . Smokeless tobacco: Never Used  Substance Use Topics  . Alcohol use: No  . Drug use: No     Allergies   Patient has no known allergies.   Review of Systems Review of Systems  Constitutional: Negative for chills and fever.  Skin: Positive for wound.       Ulceration with itching.   Physical Exam Triage Vital Signs ED Triage Vitals  Enc Vitals Group     BP 03/17/17 0945 138/61     Pulse Rate 03/17/17 0945 81     Resp 03/17/17 0945 16     Temp 03/17/17 0945 97.9 F (36.6 C)     Temp Source 03/17/17 0945 Oral     SpO2 03/17/17 0945 99 %     Weight 03/17/17 0947 200 lb (90.7 kg)     Height 03/17/17 0947 6' (1.829 m)     Head Circumference --      Peak Flow --      Pain Score 03/17/17 1018 0     Pain Loc --      Pain Edu? --      Excl. in [place]? --    Updated Vital Signs BP 138/61 (BP Location: Left Arm)   Pulse 81   Temp 97.9 F (36.6 C) (Oral)   Resp 16   Ht 6' (1.829 m)   Wt 200 lb (90.7 kg)   SpO2 99%    BMI 27.12 kg/m     Physical Exam  Constitutional: He is oriented to person, place, and time. He appears well-developed. No distress.  HENT:  Head: Normocephalic and atraumatic.  Cardiovascular: Normal rate and regular rhythm.  2/6 systolic murmur.  Pulmonary/Chest: Effort normal and breath sounds normal. He has no wheezes. He has no rales.  Neurological: He is alert and oriented to person, place, and time.  Skin:  8 mm ulceration noted behind the left ear.  No surrounding erythema.  No warmth.  No drainage.  Psychiatric: He has a normal mood and affect. His behavior is normal.  Vitals reviewed.  UC Treatments / Results  Labs (all labs ordered are listed, but only abnormal results are displayed) Labs Reviewed - No data to display  EKG  EKG Interpretation None       Radiology No results found.  Procedures Procedures (including critical care time)  Medications Ordered in UC Medications - No data to display   Initial Impression / Assessment and Plan / UC Course  I have reviewed the triage vital signs and the nursing notes.  Pertinent labs & imaging results that were available during my care of the patient were reviewed by me and considered in my medical decision making (see chart for details).     83 year old male presents with an ulceration.  Suspect skin cancer, likely squamous cell carcinoma.  Advised to keep the area moist.  Advised Vaseline.  Advised to contact his dermatologist at the [facility] or contact 1 of our local dermatologist Dr. Nehemiah Massed or Dr. Phillip Heal.   Final Clinical Impressions(s) / UC Diagnoses   Final diagnoses:  Skin ulcer, unspecified ulcer stage [facility])    ED Discharge Orders    None     Controlled Substance Prescriptions [place] Controlled Substance Registry consulted? Not Applicable   Coral Spikes, DO 03/17/17 1030

## 2017-03-17 NOTE — Discharge Instructions (Signed)
This is likely skin cancer.   Call the [facility] or one of the physicians that I recommended.  Take care  Dr. Lacinda Axon

## 2017-06-23 DIAGNOSIS — E113293 Type 2 diabetes mellitus with mild nonproliferative diabetic retinopathy without macular edema, bilateral: Secondary | ICD-10-CM | POA: Diagnosis not present

## 2017-12-03 DIAGNOSIS — M79645 Pain in left finger(s): Secondary | ICD-10-CM | POA: Diagnosis not present

## 2017-12-03 DIAGNOSIS — R0781 Pleurodynia: Secondary | ICD-10-CM | POA: Diagnosis not present

## 2017-12-03 DIAGNOSIS — E119 Type 2 diabetes mellitus without complications: Secondary | ICD-10-CM | POA: Diagnosis not present

## 2017-12-03 DIAGNOSIS — Z79899 Other long term (current) drug therapy: Secondary | ICD-10-CM | POA: Diagnosis not present

## 2017-12-03 DIAGNOSIS — M1812 Unilateral primary osteoarthritis of first carpometacarpal joint, left hand: Secondary | ICD-10-CM | POA: Diagnosis not present

## 2017-12-03 DIAGNOSIS — M546 Pain in thoracic spine: Secondary | ICD-10-CM | POA: Diagnosis not present

## 2017-12-03 DIAGNOSIS — M549 Dorsalgia, unspecified: Secondary | ICD-10-CM | POA: Diagnosis not present

## 2017-12-03 DIAGNOSIS — S80212A Abrasion, left knee, initial encounter: Secondary | ICD-10-CM | POA: Diagnosis not present

## 2017-12-03 DIAGNOSIS — I10 Essential (primary) hypertension: Secondary | ICD-10-CM | POA: Diagnosis not present

## 2017-12-03 DIAGNOSIS — G8911 Acute pain due to trauma: Secondary | ICD-10-CM | POA: Diagnosis not present

## 2017-12-03 DIAGNOSIS — M4814 Ankylosing hyperostosis [Forestier], thoracic region: Secondary | ICD-10-CM | POA: Diagnosis not present

## 2017-12-03 DIAGNOSIS — S299XXA Unspecified injury of thorax, initial encounter: Secondary | ICD-10-CM | POA: Diagnosis not present

## 2017-12-03 DIAGNOSIS — M19042 Primary osteoarthritis, left hand: Secondary | ICD-10-CM | POA: Diagnosis not present

## 2017-12-03 DIAGNOSIS — Z23 Encounter for immunization: Secondary | ICD-10-CM | POA: Diagnosis not present

## 2017-12-06 DIAGNOSIS — E7800 Pure hypercholesterolemia, unspecified: Secondary | ICD-10-CM | POA: Diagnosis not present

## 2017-12-06 DIAGNOSIS — M546 Pain in thoracic spine: Secondary | ICD-10-CM | POA: Diagnosis not present

## 2017-12-06 DIAGNOSIS — E119 Type 2 diabetes mellitus without complications: Secondary | ICD-10-CM | POA: Diagnosis not present

## 2017-12-06 DIAGNOSIS — M545 Low back pain: Secondary | ICD-10-CM | POA: Diagnosis not present

## 2017-12-06 DIAGNOSIS — I10 Essential (primary) hypertension: Secondary | ICD-10-CM | POA: Diagnosis not present

## 2017-12-06 DIAGNOSIS — Z85828 Personal history of other malignant neoplasm of skin: Secondary | ICD-10-CM | POA: Diagnosis not present

## 2018-05-29 DEATH — deceased
